# Patient Record
Sex: Male | Born: 1990 | Race: White | Hispanic: No | Marital: Single | Smoking: Former smoker
Health system: Southern US, Community
[De-identification: ages and names within clinical notes are randomized; demographics above are authoritative.]

## PROBLEM LIST (undated history)

## (undated) DIAGNOSIS — F909 Attention-deficit hyperactivity disorder, unspecified type: Secondary | ICD-10-CM

## (undated) DIAGNOSIS — L709 Acne, unspecified: Secondary | ICD-10-CM

## (undated) DIAGNOSIS — G43009 Migraine without aura, not intractable, without status migrainosus: Secondary | ICD-10-CM

## (undated) HISTORY — DX: Migraine without aura, not intractable, without status migrainosus: G43.009

## (undated) HISTORY — DX: Acne, unspecified: L70.9

## (undated) HISTORY — DX: Attention-deficit hyperactivity disorder, unspecified type: F90.9

---
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
## 2010-04-21 ENCOUNTER — Ambulatory Visit: Payer: Self-pay | Admitting: Family Medicine

## 2010-04-21 DIAGNOSIS — J358 Other chronic diseases of tonsils and adenoids: Secondary | ICD-10-CM | POA: Insufficient documentation

## 2010-06-09 NOTE — Assessment & Plan Note (Signed)
Summary: CHECK TONSILS/CLE   Vital Signs:  Patient profile:   20 year old male Height:      67 inches Weight:      162.50 pounds BMI:     25.54 Temp:     98.6 degrees F oral Pulse rate:   80 / minute Pulse rhythm:   regular BP sitting:   112 / 70  (left arm) Cuff size:   regular  Vitals Entered By: Delilah Shan CMA Duncan Dull) (April 21, 2010 2:14 PM) CC: Check tonsils   History of Present Illness: For last month patient has noticed white/yellow spots on tonsil. Malodorus.  No FCNAVD.  Feeling well o/w.   Allergies: No Known Drug Allergies  Social History: Occupation: Graduated [employer] Never Smoked Alcohol use-no Drug use-no Regular exercise-no  Review of Systems       See HPI.  Otherwise negative.    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  Noted tonsil stone, reassured.  D/w patient about mouthwash.  No other intervention needed.  D/w patient about stopping smoking.  follow up as needed.   Complete Medication List: 1)  Proair Hfa 108 (90 Base) Mcg/act Aers (Albuterol sulfate) .... 2 inh q4h as needed shortness of breath 2)  Tretinoin 0.025 % Crea (Tretinoin) .... Apply nightly to clean dry face as directed 3)  Multivitamins Tabs (Multiple vitamin) .... Take 1 tablet by mouth once a day  Patient Instructions: 1)  You have tonsil stones.  I would gargle daily but you shouldn't have to do much else about this.  Take care.    Orders Added: 1)  Est. Patient Level III [56213]    Current Allergies (reviewed today): No known allergies

## 2010-06-09 NOTE — Assessment & Plan Note (Signed)
Summary: CPX/RBH OK PER HEATHER   Vital Signs:  Patient profile:   20 year old male Height:      67 inches Weight:      162.4 pounds BMI:     25.53 Temp:     98.8 degrees F oral Pulse rate:   84 / minute Pulse rhythm:   regular BP sitting:   100 / 70  (left arm) Cuff size:   regular  Vitals Entered By: Benny Lennert CMA Duncan Dull) (December 30, 2009 10:17 AM)  History of Present Illness: Chief complaint cpx  Asthma, had been on proair and asmanex daily  spring  no job      Preventive Screening-Counseling & Management  Alcohol-Tobacco     Alcohol drinks/day: 0     Alcohol Counseling: not indicated; patient does not drink     Smoking Status: never     Tobacco Counseling: not indicated; no tobacco use  Caffeine-Diet-Exercise     Diet Counseling: not indicated; diet is assessed to be healthy     Does Patient Exercise: no     Exercise Counseling: to improve exercise regimen  Hep-HIV-STD-Contraception     HIV Risk: no risk noted     STD Risk: no risk noted     Testicular SE Education/Counseling to perform regular STE      Sexual History:  currently monogamous.        Drug Use:  never.    Current Problems (verified): 1)  Health Maintenance Exam  (ICD-V70.0) 2)  Other Acne  (ICD-706.1) 3)  Asthma  (ICD-493.90) 4)  Adhd  (ICD-314.01)  Allergies (verified): No Known Drug Allergies  Past History:  Past medical, surgical, family and social histories (including risk factors) reviewed, and no changes noted (except as noted below).  Past Medical History: Reviewed history from 12/28/2008 and no changes required. ADHD (ICD-314.01) COMMON MIGRAINE (ICD-346.10) Asthma acne  Past Surgical History: Reviewed history from 11/04/2008 and no changes required. none  Family History: Reviewed history from 11/04/2008 and no changes required. Family History Breast cancer 1st degree relative <50 Family History Diabetes 1st degree relative Family History High  cholesterol Family History Lung cancer Family History of Cardiovascular disorder  Social History: Reviewed history from 11/04/2008 and no changes required. 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 Eyes: Denies blurring. ENT: Denies earache, ear discharge, decreased hearing, nasal congestion, and sore throat. CV: Denies chest pains, dyspnea on exertion, palpitations, and syncope. Resp: asthma, no recent use of inhaler GI: Denies nausea, vomiting, diarrhea, constipation, change in bowel habits, abdominal pain, melena, BRBPR  GU: dysuria, discharge, frequency,genital sores, STD concern. MS: no back pain, joint pain, stiffness, and arthritis. Derm: acne Neuro: No abnormal gait, frequent headaches, paresthesias, seizures, vertigo, and weakness Psych: No anxiety, behavioral problems, compulsive behavior, depression, hyperactivity, and inattentive. Endo: No polydipsia, polyphagia, polyuria, and unusual weight change Heme: No bruising or LAD Allergy: No urticaria or hayfever   Otherwise, the pertinent positives and negatives are listed above and in the HPI, otherwise a full review of systems has been reviewed and is negative unless noted positive.    Impression & Recommendations:  Problem # 1:  HEALTH MAINTENANCE EXAM (ICD-V70.0) The patient's preventative maintenance and recommended screening tests for an annual wellness exam were reviewed in full today.  Brought up to date unless services declined.  Counselled on the importance of diet, exercise, and its role in overall health and mortality. The patient's FH and SH was reviewed, including their home life, tobacco status, and drug and alcohol status.   refill inhaler  change to retin-a  Complete  Medication List: 1)  Proair Hfa 108 (90 Base) Mcg/act Aers (Albuterol sulfate) .... 2 inh q4h as needed shortness of breath 2)  Tretinoin 0.025 % Crea (Tretinoin) .... Apply nightly to clean dry face as directed  Other Orders: Admin 1st Vaccine (04540) Flu Vaccine 84yrs + (98119) Tdap => 22yrs IM (14782) Pneumococcal Vaccine (95621) Admin of Any Addtl Vaccine (30865) Admin of Any Addtl Vaccine (78469) Prescriptions: TRETINOIN 0.025 % CREA (TRETINOIN) Apply nightly to clean dry face as directed  #45 grams x 11   Entered and Authorized by:   Hannah Beat MD   Signed by:   Hannah Beat MD on 12/30/2009   Method used:   Electronically to        [facility]* (retail)       [address]       [place], [place]  [address]       Ph: [phone]       Fax: [phone]   RxID:   [id number] PROAIR HFA 108 (90 BASE) MCG/ACT  AERS (ALBUTEROL SULFATE) 2 inh q4h as needed shortness of breath  #1 x 4   Entered and Authorized by:   Hannah Beat MD   Signed by:   Hannah Beat MD on 12/30/2009   Method used:   Electronically to        [facility]* (retail)       [address]       [place], [place]  [address]       Ph: [phone]       Fax: [phone]   RxID:   [id number]  Flu Vaccine Consent Questions     Do you have a history of severe allergic reactions to this vaccine? no    Any prior history of allergic reactions to egg and/or gelatin? no    Do you have a sensitivity to the preservative Thimersol? no    Do you have a past history of Guillan-Barre Syndrome? no    Do you currently have an acute febrile illness? no    Have you ever had a severe reaction to latex? no    Vaccine information given and explained to patient? yes    Are you currently pregnant? no    Lot Number:[id number]   Exp Date:11/05/2010   Site Given  Left Deltoid IMCurrent Allergies (reviewed today): No known allergies    .lbflu   Immunizations Administered:  Tetanus Vaccine:    Vaccine  Type: Tdap    Site: right deltoid    Mfr: GlaxoSmithKline    Dose: 0.5 ml    Route: IM    Given by: Benny Lennert CMA (AAMA)    Exp. Date: 02/25/2012    Lot #: [id number]    VIS given: 03/26/07 version given December 30, 2009.  Pneumonia Vaccine:    Vaccine Type: Pneumovax    Site: right deltoid    Mfr: Merck    Dose: 0.5 ml    Route: IM    Given by: Benny Lennert CMA (AAMA)    Exp. Date: 07/13/2011    Lot #: [id number]    VIS given: 12/04/95 version given December 30, 2009.    General Medical Physical Exam:  General Appearance:  Well-developed,well-nourished,in no acute distress; alert,appropriate and cooperative throughout examination VS reviewed- afebrile, normotensive  Head:      Inspection:     normocephalic without obvious abnormalities      Palpation:     no abnormal lesions palpable  Eyes:      External:     conjunctiva and lids normal      Pupils:     equal, round, and reactive to light and accommodation      Fundus:     discs sharp and flat; no a/v nicking, hemorrhages, or exudates  Ears, Nose, Throat:      External:     no significant lesions or deformities noted      Otoscopic:     canals clear; tympanic membranes intact with normal light reflex      Hearing:     grossly intact      Nasal:     External nasal examination shows no deformity or inflammation. Nasal mucosa are pink and moist without lesions or exudates.      Dental:     good dentition      Pharynx:     tongue normal; posterior pharynx without erythema or exudate  Neck:      Neck:     supple; no masses; trachea midline      Thyroid:     no nodules, masses, tenderness, or enlargement  Respiratory:      Resp. effort:     no intercostal retractions or use of accessory muscles      Percussion:     no dullness      Palpation:     normal fremitus      Auscultation:     no rales, rhonchi, or wheezes  Chest Wall:      Chest wall:     no masses or gynecomastia      Axilla:     no axillary  adenopathy  Cardiovascular:      Palpation:     no thrill or displacement of PMI      Auscultation:     normal S1 and  S2; no murmur, rub, or gallop  Gastrointestinal:      Abdomen:     soft and non-tender with normal bowel sounds; no masses      Liver/spleen:     normal to percussion; no enlargement or nodularity      Hernia:     no hernias      Stool:     not done  Genitourinary:      Scrotum:     no lesions, cysts, edema, or rash      Penis:     no lesions or discharge  Musculoskeletal:      Gait/station:     normal gait; no ataxia      Digits/nails:     no cyanosis, clubbing, or petechiae      Head/neck:     normal alignment and mobility      Trunk:     normal alignment and mobility; no deformity      RUE:     normal range of motion and strength      RLE:     normal range of motion and strength      LUE:     normal range of motion and strength      LLE:       normal range of motion and strength  Lymphatic:      Neck:  no cervical adenopathy      Axilla:     no axillary adenopathy      Inguinal:     no inguinal adenopathy      Other:     no other adenopathy  Skin:      Inspection:     no rashes, suspicious lesions, or ulcerations      Palpation:     no subcutaneous nodules or induration      Other:     acne  Neurological:      Sensory:     intact to touch  Psychiatric:      Judgement:     intact      Orientation:     oriented to time, place, and person      Memory:     intact for recent and remote events      Mood/affect:     no appearance of anxiety, depression, or agitation

## 2010-06-09 NOTE — Assessment & Plan Note (Signed)
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  Does not hurt too much, maybe just a little sore.  Thought he saw pus on his tonsils as well. No fevers, chills, cough, wheezing, or shortness of breath. Has not had any URI symptoms. No pain or difficulty with swallowing. No sick contacts.  Current Medications (verified): 1)  Proair Hfa 108 (90 Base) Mcg/act  Aers (Albuterol Sulfate) .... 2 Inh Q4h As Needed Shortness of Breath 2)  Clindamycin Phosphate 1 % Gel (Clindamycin Phosphate) .... Apply To Face Two Times A Day, Disp 1 Month Supply 3)  Asmanex 30 Metered Doses 220 Mcg/inh Aepb (Mometasone Furoate) .... One Inhalation Daily  Allergies (verified): No Known Drug Allergies  Review of Systems      See HPI General:  Denies chills, fever, and malaise. Eyes:  Denies blurring. ENT:  Complains of sore throat; denies decreased hearing, difficulty swallowing, hoarseness, nasal congestion, and sinus pressure. CV:  Denies chest pain or discomfort. Resp:  Denies cough. GI:  Denies diarrhea, nausea, and vomiting.  Physical Exam  General:  Well-developed,well-nourished,in no acute distress; alert,appropriate and cooperative throughout examination VS reviewed- afebrile, normotensive Ears:  External ear exam shows no significant lesions or deformities.  Otoscopic examination reveals clear canals, tympanic membranes are intact bilaterally without bulging, retraction, inflammation or discharge. Hearing is grossly normal bilaterally. Nose:  External nasal  examination shows no deformity or inflammation. Nasal mucosa are pink and moist without lesions or exudates. Mouth:  Oral mucosa and oropharynx without lesions or exudates.   mild tonsillar enlargement (appears to be baseline). Lungs:  Normal respiratory effort, chest expands symmetrically. Lungs are clear to auscultation, no crackles or wheezes. Heart:  Normal rate and regular rhythm. S1 and S2 normal without gallop, murmur, click, rub or other extra sounds. Extremities:  no edema Psych:  normally interactive.     Impression & Recommendations:  Problem # 1:  ACUTE PHARYNGITIS (ICD-462) Assessment New Likely viral.  Rapid strep neg with no typical strep symptoms or physical exam findings. Continue supportive care with Tylenol/motrin. Orders: Rapid Strep (52841)  Complete Medication List: 1)  Proair Hfa 108 (90 Base) Mcg/act Aers (Albuterol sulfate) .... 2 inh q4h as needed shortness of breath 2)  Clindamycin Phosphate 1 % Gel (Clindamycin phosphate) .... Apply to face two times a day, disp 1 month supply 3)  Asmanex 30 Metered Doses 220 Mcg/inh Aepb (Mometasone furoate) .... One inhalation daily  Current Allergies (reviewed today): No known allergies   Laboratory Results   Date/Time Reported: June 11, 2009 12:01 PM   Other Tests  Rapid Strep: negative

## 2010-07-20 ENCOUNTER — Ambulatory Visit (INDEPENDENT_AMBULATORY_CARE_PROVIDER_SITE_OTHER): Payer: Self-pay | Admitting: Family Medicine

## 2010-07-20 ENCOUNTER — Encounter (INDEPENDENT_AMBULATORY_CARE_PROVIDER_SITE_OTHER): Payer: Self-pay | Admitting: *Deleted

## 2010-07-20 ENCOUNTER — Encounter: Payer: Self-pay | Admitting: Family Medicine

## 2010-07-20 DIAGNOSIS — J020 Streptococcal pharyngitis: Secondary | ICD-10-CM | POA: Insufficient documentation

## 2010-07-20 LAB — CONVERTED CEMR LAB: Rapid Strep: POSITIVE

## 2010-07-26 NOTE — Letter (Signed)
Summary: Out of Work  [facility] [employer] [facility]  [address] [place], [place] [address]   Phone: [phone]  Fax: [phone]    July 20, 2010   Employee:  Joseph Hutchinson    To Whom It May Concern:   For Medical reasons, please excuse the above named employee from work for the following dates:  Start:July 20, 2010 12:03 PM     End:May return July 22 2010    If you need additional information, please feel free to contact our office.         Sincerely,    Spencer Copland md

## 2010-07-26 NOTE — Assessment & Plan Note (Signed)
Summary: ST/CLE  BCBS   Vital Signs:  Patient profile:   20 year old male Height:      67 inches Weight:      168.50 pounds BMI:     26.49 Temp:     103.0 degrees F oral Pulse rate:   80 / minute Pulse rhythm:   regular BP sitting:   110 / 68  (left arm) Cuff size:   regular  Vitals Entered By: Benny Lennert CMA Duncan Dull) (July 20, 2010 11:52 AM)  History of Present Illness: Chief complaint sore throat  Acute Visit History:      The patient complains of fever, headache, musculoskeletal symptoms, and sore throat.  These symptoms began 3 days ago.  He denies nasal discharge, nausea, and vomiting.  Other comments include: aching and feeling bad.        He has had dysphagia associated with the sore throat.  There is no history of drooling, cold/URI symptoms, or recent exposure to strep.        Urine output has been normal.  He is tolerating clear liquids.        Allergies (verified): No Known Drug Allergies  Past History:  Past medical, surgical, family and social histories (including risk factors) reviewed, and no changes noted (except as noted below).  Past Medical History: Reviewed history from 12/28/2008 and no changes required. ADHD (ICD-314.01) COMMON MIGRAINE (ICD-346.10) Asthma acne  Past Surgical History: Reviewed history from 11/04/2008 and no changes required. none  Family History: Reviewed history from 11/04/2008 and no changes required. Family History Breast cancer 1st degree relative <50 Family History Diabetes 1st degree relative Family History High cholesterol Family History Lung cancer Family History of Cardiovascular disorder  Social History: Reviewed history from 04/21/2010 and no changes required. Occupation: Graduated [employer] Single Never Smoked Alcohol use-no Drug use-no Regular exercise-no  Review of Systems       REVIEW OF SYSTEMS GEN: Acute illness details above. CV: No chest pain or SOB GI: No noted N or V Otherwise, pertinent  positives and negatives are noted in the HPI.   Physical Exam  General:  Well-developed,well-nourished,in no acute distress; alert,appropriate and cooperative throughout examination Head:  Normocephalic and atraumatic without obvious abnormalities. No apparent alopecia or balding. Ears:  External ear exam shows no significant lesions or deformities.  Otoscopic examination reveals clear canals, tympanic membranes are intact bilaterally without bulging, retraction, inflammation or discharge. Hearing is grossly normal bilaterally. Mouth:  large tonsils with significant exudate Lungs:  Normal respiratory effort, chest expands symmetrically. Lungs are clear to auscultation, no crackles or wheezes. Heart:  Normal rate and regular rhythm. S1 and S2 normal without gallop, murmur, click, rub or other extra sounds. Cervical Nodes:  large ant LAD Psych:  Cognition and judgment appear intact. Alert and cooperative with normal attention span and concentration. No apparent delusions, illusions, hallucinations   Impression & Recommendations:  Problem # 1:  STREPTOCOCCAL SORE THROAT (ICD-034.0) Assessment New  Strep + Bicillin LA  Orders: Rapid Strep ([address]) Admin of Therapeutic Inj  intramuscular or subcutaneous (96372) Bicillin CR 1.2 million units Injection (U0454)  I call if not improved in 48 hours.   Complete Medication List: 1)  Proair Hfa 108 (90 Base) Mcg/act Aers (Albuterol sulfate) .... 2 inh q4h as needed shortness of breath 2)  Tretinoin 0.025 % Crea (Tretinoin) .... Apply nightly to clean dry face as directed 3)  Multivitamins Tabs (Multiple vitamin) .... Take 1 tablet by mouth once a day  Medication Administration  Injection # 1:    Medication: Bicillin CR 1.2 million units Injection    Diagnosis: STREPTOCOCCAL SORE THROAT (ICD-034.0)    Route: IM    Site: RUOQ gluteus    Exp Date: 07/06/2012    Lot #: [id number]    Mfr: king    Patient tolerated injection without  complications    Given by: Benny Lennert CMA Duncan Dull) (July 20, 2010 12:50 PM)  Orders Added: 1)  Rapid Strep [66440] 2)  Admin of Therapeutic Inj  intramuscular or subcutaneous [96372] 3)  Bicillin CR 1.2 million units Injection [J0558] 4)  Est. Patient Level IV [34742]    Current Allergies (reviewed today): No known allergies    Laboratory Results    Other Tests  Rapid Strep: positive  Kit Test Internal QC: Negative   (Normal Range: Negative)

## 2010-11-09 ENCOUNTER — Encounter: Payer: Self-pay | Admitting: Family Medicine

## 2010-11-17 ENCOUNTER — Encounter: Payer: BC Managed Care – PPO | Admitting: Family Medicine

## 2010-11-21 ENCOUNTER — Encounter: Payer: Self-pay | Admitting: Family Medicine

## 2010-11-21 ENCOUNTER — Ambulatory Visit (INDEPENDENT_AMBULATORY_CARE_PROVIDER_SITE_OTHER): Payer: BC Managed Care – PPO | Admitting: Family Medicine

## 2010-11-21 VITALS — BP 120/70 | HR 76 | Temp 98.0°F | Ht 68.0 in | Wt 159.1 lb

## 2010-11-21 DIAGNOSIS — Z0000 Encounter for general adult medical examination without abnormal findings: Secondary | ICD-10-CM

## 2010-11-21 MED ORDER — CITALOPRAM HYDROBROMIDE 20 MG PO TABS: 20.0000 mg | ORAL_TABLET | Freq: Every day | ORAL | Status: DC

## 2010-11-21 NOTE — Patient Instructions (Signed)
F/u 4-6 weeks

## 2010-11-21 NOTE — Progress Notes (Signed)
Joseph Hutchinson, a 20 y.o. male presents today in the office for the following:    11 - 3:30 AM. Ups. Gets home around 4, tried to go to bed then.  5 days a week Sleeping 10-12 hours a night No coffee, no energy drinks. 3-4 cups of iced tea a day  Has been having some problems getting to sleep Allergies are flaring up some  Having a hard time getting to sleep.  Rarely using inhaler  Preventative Health Maintenance Visit:  Health Maintenance Summary Reviewed and updated, unless pt declines services.  Tobacco History Reviewed. Alcohol: No concerns, no excessive use Exercise Habits: Some activity, rec at least 30 mins 5 times a week STD concerns: no risk or activity to increase risk Drug Use: None Encouraged self-testicular check  Health Maintenance  Topic Date Due  . Tetanus/tdap  12/31/2019   Patient Active Problem List  Diagnoses  . ADHD  . ASTHMA  . OTHER ACNE  . Routine general medical examination at a health care facility   Past Medical History  Diagnosis Date  . ADHD (attention deficit hyperactivity disorder)   . Common migraine   . Asthma   . Acne    No past surgical history on file. History  Substance Use Topics  . Smoking status: Never Smoker   . Smokeless tobacco: Not on file  . Alcohol Use: No   Family History  Problem Relation Age of Onset  . Breast cancer      1st degree relative <50  . Diabetes      1st degree relative  . Hyperlipidemia      fam hx  . Lung cancer      fam hx  . Heart disease      fam hx   No Known Allergies Current Outpatient Prescriptions on File Prior to Visit  Medication Sig Dispense Refill  . albuterol (PROAIR HFA) 108 (90 BASE) MCG/ACT inhaler Inhale 2 puffs into the lungs every 4 (four) hours as needed. Shortness of breath       . tretinoin (RETIN-A) 0.025 % cream Apply topically at bedtime. To a clean dry face as directed       . Multiple Vitamin (MULTIVITAMIN) tablet Take 1 tablet by mouth daily.          General: Denies fever, chills, sweats. No significant weight loss. Sleep probs noted Eyes: Denies blurring,significant itching ENT: Denies earache, sore throat, and hoarseness. Cardiovascular: Denies chest pains, palpitations, dyspnea on exertion Respiratory: Denies cough, dyspnea at rest,wheeezing Breast: no concerns about lumps GI: Denies nausea, vomiting, diarrhea, constipation, change in bowel habits, abdominal pain, melena, hematochezia GU: Denies penile discharge, ED, urinary flow / outflow problems. No STD concerns. Musculoskeletal: Denies back pain, joint pain Derm: Denies rash, itching Neuro: Denies  paresthesias, frequent falls, frequent headaches Psych: + dep Endocrine: Denies cold intolerance, heat intolerance, polydipsia Heme: Denies enlarged lymph nodes Allergy: occ allergies   Physical Exam  Blood pressure 120/70, pulse 76, temperature 98 F (36.7 C), temperature source Oral, height 5\' 8"  (1.727 m), weight 159 lb 1.9 oz (72.176 kg).  PE: GEN: well developed, well nourished, no acute distress Eyes: conjunctiva and lids normal, PERRLA, EOMI ENT: TM clear, nares clear, oral exam WNL Neck: supple, no lymphadenopathy, no thyromegaly, no JVD Pulm: clear to auscultation and percussion, respiratory effort normal CV: regular rate and rhythm, S1-S2, no murmur, rub or gallop, no bruits, peripheral pulses normal and symmetric, no cyanosis, clubbing, edema or varicosities Chest: no scars, masses,  no gynecomastia   GI: soft, non-tender; no hepatosplenomegaly, masses; active bowel sounds all quadrants GU: no hernia, testicular mass, penile discharge Lymph: no cervical, axillary or inguinal adenopathy MSK: gait normal, muscle tone and strength WNL, no joint swelling, effusions, discoloration, crepitus  SKIN: clear, good turgor, color WNL, no rashes, lesions, or ulcerations Neuro: normal mental status, normal strength, sensation, and motion Psych: alert; oriented to person,  place and time. Affect more flat than is typical for this patient  Assessment and Plan: 1.  The patient's preventative maintenance and recommended screening tests for an annual wellness exam were reviewed in full today. Brought up to date unless services declined.  Counselled on the importance of diet, exercise, and its role in overall health and mortality. The patient's FH and SH was reviewed, including their home life, tobacco status, and drug and alcohol status.  Discussed sleep hygiene Melatonin I think he is depressed -- he feels depressed. He wants to talk about going on an antidepressant with Mom before starting

## 2010-11-22 LAB — BASIC METABOLIC PANEL
BUN: 14 mg/dL (ref 6–23)
CO2: 32 mEq/L (ref 19–32)
Calcium: 9.3 mg/dL (ref 8.4–10.5)
Chloride: 105 mEq/L (ref 96–112)
Creatinine, Ser: 1 mg/dL (ref 0.4–1.5)
GFR: 100.78 mL/min (ref >60.00)
Glucose, Bld: 90 mg/dL (ref 70–99)
Potassium: 4.3 mEq/L (ref 3.5–5.1)
Sodium: 140 mEq/L (ref 135–145)

## 2010-11-22 LAB — LDL CHOLESTEROL, DIRECT: Direct LDL: 122.6 mg/dL

## 2012-06-12 ENCOUNTER — Ambulatory Visit (INDEPENDENT_AMBULATORY_CARE_PROVIDER_SITE_OTHER): Payer: BC Managed Care – PPO | Admitting: Family Medicine

## 2012-06-12 ENCOUNTER — Encounter: Payer: Self-pay | Admitting: Family Medicine

## 2012-06-12 VITALS — BP 120/70 | HR 63 | Temp 97.6°F | Ht 68.0 in | Wt 155.8 lb

## 2012-06-12 DIAGNOSIS — M549 Dorsalgia, unspecified: Secondary | ICD-10-CM

## 2012-06-12 DIAGNOSIS — Z202 Contact with and (suspected) exposure to infections with a predominantly sexual mode of transmission: Secondary | ICD-10-CM

## 2012-06-12 DIAGNOSIS — J111 Influenza due to unidentified influenza virus with other respiratory manifestations: Secondary | ICD-10-CM

## 2012-06-12 DIAGNOSIS — Z2089 Contact with and (suspected) exposure to other communicable diseases: Secondary | ICD-10-CM

## 2012-06-12 MED ORDER — AZITHROMYCIN 250 MG PO TABS: ORAL_TABLET | ORAL | Status: DC

## 2012-06-12 MED ORDER — CEFTRIAXONE SODIUM 1 G IJ SOLR
250.0000 mg | Freq: Once | INTRAMUSCULAR | Status: AC
  Administered 2012-06-12: 250 mg via INTRAMUSCULAR

## 2012-06-12 NOTE — Progress Notes (Signed)
Nature conservation officer at [facility] [address] [place] [address] Phone: [phone] Fax: [phone]  Date:  06/12/2012   Name:  Joseph Hutchinson   DOB:  03/15/1991   MRN:  [id number] Gender: male Age: 22 y.o.  Primary Physician:  Hannah Beat, MD  Evaluating MD: Hannah Beat, MD   Chief Complaint: Follow-up and check for std   History of Present Illness:  MCDONALD Joseph Hutchinson is a 22 y.o. pleasant patient who presents with the following:  STD exposure: Hurting and everything hurts. Girlfriend has chlamydia. No penile discharge, no warts, no ulcers. No known prior STI. 3 different girlfriends in a short time.   LBP, ? occ radiculopathy on the L, occ tingling. No bowel or bladder incont. Both sides, erector spinae.   Getting over flu, no asthma sx, resolving cough  Patient Active Problem List  Diagnosis  . ADHD  . ASTHMA  . OTHER ACNE    Past Medical History  Diagnosis Date  . ADHD (attention deficit hyperactivity disorder)   . Common migraine   . Asthma   . Acne     No past surgical history on file.  History  Substance Use Topics  . Smoking status: Never Smoker   . Smokeless tobacco: Not on file  . Alcohol Use: No    Family History  Problem Relation Age of Onset  . Breast cancer      1st degree relative <50  . Diabetes      1st degree relative  . Hyperlipidemia      fam hx  . Lung cancer      fam hx  . Heart disease      fam hx    No Known Allergies  Medication list has been reviewed and updated.  Outpatient Prescriptions Prior to Visit  Medication Sig Dispense Refill  . citalopram (CELEXA) 20 MG tablet Take 1 tablet (20 mg total) by mouth daily.  30 tablet  1  . [DISCONTINUED] albuterol (PROAIR HFA) 108 (90 BASE) MCG/ACT inhaler Inhale 2 puffs into the lungs every 4 (four) hours as needed. Shortness of breath       . [DISCONTINUED] Multiple Vitamin (MULTIVITAMIN) tablet Take 1 tablet by mouth daily.        . [DISCONTINUED]  tretinoin (RETIN-A) 0.025 % cream Apply topically at bedtime. To a clean dry face as directed        Last reviewed on 11/21/2010  4:53 PM by Hannah Beat, MD  Review of Systems:   GEN: recent flu GI: No n/v/d, eating normally Pulm: No SOB Interactive and getting along well at home.  Otherwise, ROS is as per the HPI.   Physical Examination: BP 120/70  Pulse 63  Temp 97.6 F (36.4 C) (Oral)  Ht 5\' 8"  (1.727 m)  Wt 155 lb 12 oz (70.648 kg)  BMI 23.68 kg/m2  SpO2 97%  Ideal Body Weight: Weight in (lb) to have BMI = 25: 164.1    GEN: WDWN, NAD, Non-toxic, Alert & Oriented x 3 HEENT: Atraumatic, Normocephalic.  Ears and Nose: No external deformity. EXTR: No clubbing/cyanosis/edema NEURO: Normal gait.  PSYCH: Normally interactive. Conversant. Not depressed or anxious appearing.  Calm demeanor.  GU: no lesions, normal descended male. O/w normal. MSK> TTP B erector spinae, L2-s1. SLR neg. neurovasc intact.  Assessment and Plan:  1. Exposure to STD  GC/chlamydia probe amp, urine, HIV antibody, RPR, Trichomonas vaginalis, RNA, cefTRIAXone (ROCEPHIN) injection 250 mg  2. Influenza  3. Back pain     Known girlfriend with CMZ, rx with azithro and rocephin to cover gonorrhea too  [employer] back program  Orders Today:  Orders Placed This Encounter  Procedures  . Trichomonas vaginalis, RNA  . GC/chlamydia probe amp, urine  . HIV antibody  . RPR    Updated Medication List: (Includes new medications, updates to list, dose adjustments) Meds ordered this encounter  Medications  . azithromycin (ZITHROMAX) 250 MG tablet    Sig: 4 tablets po at once    Dispense:  4 tablet    Refill:  0  . cefTRIAXone (ROCEPHIN) injection 250 mg    Sig:     Medications Discontinued: Medications Discontinued During This Encounter  Medication Reason  . tretinoin (RETIN-A) 0.025 % cream Error  . Multiple Vitamin (MULTIVITAMIN) tablet Error  . albuterol (PROAIR HFA) 108 (90 BASE) MCG/ACT  inhaler Error     Signed, Noretta Frier T. Jillyan Plitt, MD 06/12/2012 11:16 AM

## 2012-06-14 ENCOUNTER — Encounter: Payer: Self-pay | Admitting: *Deleted

## 2012-06-15 LAB — GC/CHLAMYDIA PROBE AMP, URINE: GC Probe Amp, Urine: NEGATIVE

## 2012-06-19 LAB — TRICHOMONAS VAGINALIS, PROBE AMP: T vaginalis RNA: NEGATIVE

## 2012-06-24 ENCOUNTER — Encounter: Payer: Self-pay | Admitting: *Deleted

## 2012-06-26 ENCOUNTER — Telehealth: Payer: Self-pay

## 2012-06-26 NOTE — Telephone Encounter (Signed)
Brandi with [facility] Dept left v/m requesting treatment info; pt seen 06/13/12.with positive test results and Brandi did not receive report of treatment for pt. Brandi needs to know pt was notified and treated and info including pt's DOB,name of medication pt was treated with,dosage and date med was given.Brandi request call back.

## 2012-06-26 NOTE — Telephone Encounter (Signed)
06/13/2012 ov Given Azithromycin 2 grams po and Rocephin 250 mg IM  Give other info

## 2012-06-27 NOTE — Telephone Encounter (Signed)
Joseph Hutchinson at health dept advised

## 2012-09-03 ENCOUNTER — Encounter: Payer: Self-pay | Admitting: Family Medicine

## 2012-09-03 ENCOUNTER — Encounter: Payer: Self-pay | Admitting: *Deleted

## 2012-09-03 ENCOUNTER — Ambulatory Visit (INDEPENDENT_AMBULATORY_CARE_PROVIDER_SITE_OTHER): Payer: BC Managed Care – PPO | Admitting: Family Medicine

## 2012-09-03 VITALS — HR 92 | Temp 98.5°F | Ht 68.0 in | Wt 154.5 lb

## 2012-09-03 DIAGNOSIS — J029 Acute pharyngitis, unspecified: Secondary | ICD-10-CM | POA: Insufficient documentation

## 2012-09-03 DIAGNOSIS — Z8619 Personal history of other infectious and parasitic diseases: Secondary | ICD-10-CM

## 2012-09-03 DIAGNOSIS — Z202 Contact with and (suspected) exposure to infections with a predominantly sexual mode of transmission: Secondary | ICD-10-CM

## 2012-09-03 LAB — POCT RAPID STREP A (OFFICE): Rapid Strep A Screen: NEGATIVE

## 2012-09-03 MED ORDER — AMOXICILLIN-POT CLAVULANATE 875-125 MG PO TABS: 1.0000 | ORAL_TABLET | Freq: Two times a day (BID) | ORAL | Status: DC

## 2012-09-03 NOTE — Progress Notes (Signed)
  Subjective:    Patient ID: Joseph Hutchinson, male    DOB: 1991-04-11, 22 y.o.   MRN: [id number]  Sore Throat  This is a new problem. The current episode started in the past 7 days. The problem has been gradually worsening. Maximum temperature: subjective. The pain is at a severity of 2/10. The pain is mild. Associated symptoms include congestion, coughing, neck pain, shortness of breath, swollen glands and trouble swallowing. Pertinent negatives include no diarrhea, drooling, ear discharge, ear pain or vomiting. Associated symptoms comments: Sinus pressure  dizziness Sore thraot and swollen tonsils started in last 3-4 days Heart racing, fatigued. He has had no exposure to strep or mono. He has tried NSAIDs for the symptoms. The treatment provided mild relief.  Cough The problem occurs hourly. The cough is non-productive. Associated symptoms include a fever and shortness of breath. Pertinent negatives include no ear pain. His past medical history is significant for asthma. There is no history of emphysema.   Had chlamydia iunfeciton in 06/2012.. Covered with azithromycin.  He wishes to be re-tested for this for resolution. No symptoms.     Review of Systems  Constitutional: Positive for fever. Negative for fatigue.  HENT: Positive for congestion, trouble swallowing and neck pain. Negative for ear pain, drooling and ear discharge.   Eyes: Negative for pain.  Respiratory: Positive for cough and shortness of breath.   Gastrointestinal: Negative for vomiting and diarrhea.  Genitourinary: Negative for urgency, scrotal swelling and penile pain.       Objective:   Physical Exam  Constitutional: Vital signs are normal. He appears well-developed and well-nourished.  Non-toxic appearance. He does not appear ill. No distress.  HENT:  Head: Normocephalic and atraumatic.  Right Ear: Hearing, tympanic membrane, external ear and ear canal normal. No tenderness. No foreign bodies. Tympanic membrane is  not retracted and not bulging.  Left Ear: Hearing, tympanic membrane, external ear and ear canal normal. No tenderness. No foreign bodies. Tympanic membrane is not retracted and not bulging.  Nose: Nose normal. No mucosal edema or rhinorrhea. Right sinus exhibits no maxillary sinus tenderness and no frontal sinus tenderness. Left sinus exhibits no maxillary sinus tenderness and no frontal sinus tenderness.  Mouth/Throat: Uvula is midline and mucous membranes are normal. Normal dentition. No dental caries. Oropharyngeal exudate, posterior oropharyngeal edema and posterior oropharyngeal erythema present. No tonsillar abscesses.  copius pus in oropharynx on tonsils, left tonsil enlarged and firm, brownish red discoloration  Eyes: Conjunctivae, EOM and lids are normal. Pupils are equal, round, and reactive to light. No foreign bodies found.  Neck: Trachea normal, normal range of motion and phonation normal. Neck supple. Carotid bruit is not present. No mass and no thyromegaly present.  Cardiovascular: Normal rate, regular rhythm, S1 normal, S2 normal, normal heart sounds, intact distal pulses and normal pulses.  Exam reveals no gallop.   No murmur heard. Pulmonary/Chest: Effort normal and breath sounds normal. No respiratory distress. He has no wheezes. He has no rhonchi. He has no rales.  Abdominal: Soft. Normal appearance and bowel sounds are normal. There is no hepatosplenomegaly. There is no tenderness. There is no rebound, no guarding and no CVA tenderness. No hernia.  Neurological: He is alert. He has normal reflexes.  Skin: Skin is warm, dry and intact. No rash noted.  Psychiatric: He has a normal mood and affect. His speech is normal and behavior is normal. Judgment normal.          Assessment & Plan:

## 2012-09-03 NOTE — Assessment & Plan Note (Signed)
Re-evaluate for resolution .

## 2012-09-03 NOTE — Assessment & Plan Note (Signed)
Strep test negative in office ,.. But copius pus on tonsils. Treat with broad antibiotics Augmentin. If not improving in 48-72 hours.. Consider ENT referral given unusual appearance on left tonsil with discoloration.

## 2012-09-03 NOTE — Patient Instructions (Addendum)
Start augmentin for 10 days. Start mucinex DM for nasal congestion and cough, add nasal saline irrigation or spray. Call if sore throat not improving some in 48-72 hours or if continue with measured fever, or any diffiuclty swallowing . If severe swallowing issues or shortness of breath.. Go to ER.  Stay put of work until 24 hour after fever is resolved.  Return first morning urine of the day sample.

## 2012-09-04 ENCOUNTER — Encounter: Payer: Self-pay | Admitting: *Deleted

## 2012-09-04 ENCOUNTER — Telehealth: Payer: Self-pay

## 2012-09-04 ENCOUNTER — Telehealth: Payer: Self-pay | Admitting: *Deleted

## 2012-09-04 NOTE — Telephone Encounter (Signed)
Patient calling wanting an ent referral to see if they will take his tonsils out. I explained to patient just b/c he goes to ent it doesn't mean they will take them out

## 2012-09-04 NOTE — Telephone Encounter (Signed)
Check on him - on augmentin.  If not getting better, i might should check for a couple of other things

## 2012-09-04 NOTE — Telephone Encounter (Signed)
Triage Record Num: [id number] Operator: Sula Rumple Patient Name: Joseph Hutchinson Call Date & Time: 09/03/2012 10:32:59PM Patient Phone: [phone] PCP: Linward Headland Patient Gender: Male PCP Fax : [phone] Patient DOB: 1990-11-25 Practice Name: [facility] Reason for Call: Caller: Annette/Mother; PCP: Hannah Beat (Family Practice); CB#: [phone]; Call regarding Fever 103; Mom/Annette calling on 09/03/12 states pt was seen today in the office with a sore throat/strep culture was negative/ pt had a temp of 99 in the office/ and is now 103. Pt was ordered an antibiotic and has taken one dose today/ pt was given 800 mg of Motrin 30-40 minutes ago. All emergent sx ruled out per Sore Throat Protocol: See in 24 hrs per Had a negative rapid strep test/having continued symptoms of sroe throat, fever. swollen glands. Protocol(s) Used: Sore Throat or Hoarseness Recommended Outcome per Protocol: See Provider within 24 hours Reason for Outcome: Had negative rapid strep test but no throat culture; having continued symptoms of sore throat, fever, swollen glands Care Advice: ~

## 2012-09-04 NOTE — Telephone Encounter (Signed)
Spoke with patient and he is feeling a little bit better and does not have a fever now. Patient will call back if fever comes back

## 2012-09-04 NOTE — Telephone Encounter (Signed)
He just needs to get better from his acute infection - there is no way they would do it now.

## 2012-09-05 LAB — GC/CHLAMYDIA PROBE AMP, URINE: GC Probe Amp, Urine: NEGATIVE

## 2012-09-05 NOTE — Telephone Encounter (Signed)
Message left advising patient. Instructed to call once he is better if he still wants to pursue referral.

## 2012-09-06 ENCOUNTER — Encounter: Payer: Self-pay | Admitting: *Deleted

## 2012-09-09 ENCOUNTER — Telehealth: Payer: Self-pay | Admitting: *Deleted

## 2012-09-09 NOTE — Telephone Encounter (Signed)
Opened in error

## 2013-03-14 ENCOUNTER — Ambulatory Visit (INDEPENDENT_AMBULATORY_CARE_PROVIDER_SITE_OTHER)
Admission: RE | Admit: 2013-03-14 | Discharge: 2013-03-14 | Disposition: A | Discharge status: Home / Self Care | Payer: BC Managed Care – PPO | Source: Ambulatory Visit | Attending: Family Medicine | Admitting: Family Medicine

## 2013-03-14 ENCOUNTER — Encounter: Payer: Self-pay | Admitting: Family Medicine

## 2013-03-14 ENCOUNTER — Ambulatory Visit (INDEPENDENT_AMBULATORY_CARE_PROVIDER_SITE_OTHER): Payer: BC Managed Care – PPO | Admitting: Family Medicine

## 2013-03-14 VITALS — BP 122/80 | HR 80 | Temp 97.5°F | Wt 160.0 lb

## 2013-03-14 DIAGNOSIS — S93409A Sprain of unspecified ligament of unspecified ankle, initial encounter: Secondary | ICD-10-CM

## 2013-03-14 DIAGNOSIS — S93402A Sprain of unspecified ligament of left ankle, initial encounter: Secondary | ICD-10-CM

## 2013-03-14 MED ORDER — HYDROCODONE-ACETAMINOPHEN 5-325 MG PO TABS: 1.0000 | ORAL_TABLET | Freq: Three times a day (TID) | ORAL | Status: DC | PRN

## 2013-03-14 NOTE — Patient Instructions (Signed)
Take up to 600mg  of ibuprofen up to 3 times a day with food.  Take hydrocodone if needed.  Use the crutches and CAM walker for now.  Gradually stop using the crutches.  Ice your foot a few times a day.  Don't drive in the CAM walker.  Give [place] up update next week. We may need to re-xray you.   Take care.

## 2013-03-14 NOTE — Progress Notes (Signed)
Pre-visit discussion using our clinic review tool. No additional management support is needed unless otherwise documented below in the visit note.  Stepped in a hole this AM.  Heard a pop.  Had a CAM walker and crutches already.  Pain with weight bearing.  Inversion mechanism of injury.    Meds, vitals, and allergies reviewed.   ROS: See HPI.  Otherwise, noncontributory.  nad but uncomfortable L foot with inspection noted for puffiness but no bruising.   Mortise stable Puffy and ttp at lateral mal.  5th MT not ttp Distally NV intact  xrays neg for fx

## 2013-03-17 ENCOUNTER — Encounter: Payer: Self-pay | Admitting: *Deleted

## 2013-03-17 ENCOUNTER — Telehealth: Payer: Self-pay | Admitting: *Deleted

## 2013-03-17 DIAGNOSIS — S93409A Sprain of unspecified ligament of unspecified ankle, initial encounter: Secondary | ICD-10-CM | POA: Insufficient documentation

## 2013-03-17 NOTE — Assessment & Plan Note (Signed)
Has a CAM walker, use that and crutches.  Advised not to drive in the boot.  Pain meds prn and OTC nsaids with GI caution, discussed.  Call back about f/u with PCP re: ankle.  He agrees.  Work note given.  Ice ankle in meantime.

## 2013-03-17 NOTE — Telephone Encounter (Signed)
Faxed

## 2013-03-17 NOTE — Telephone Encounter (Signed)
Give him a note, okay to return to work.  Thanks.

## 2013-03-17 NOTE — Telephone Encounter (Signed)
Patient was in on Friday with ankle problem.  He was given a work release note but the ankle started feeling so much better over the weekend that he reported to work today but they won't let him work.  Can he get a note faxed saying it is ok to return to work?  Fax # [phone]

## 2013-04-10 ENCOUNTER — Ambulatory Visit: Payer: BC Managed Care – PPO | Admitting: Family Medicine

## 2013-07-01 ENCOUNTER — Ambulatory Visit (INDEPENDENT_AMBULATORY_CARE_PROVIDER_SITE_OTHER): Payer: BC Managed Care – PPO | Admitting: Internal Medicine

## 2013-07-01 ENCOUNTER — Encounter: Payer: Self-pay | Admitting: Internal Medicine

## 2013-07-01 VITALS — BP 114/72 | HR 77 | Temp 97.9°F | Wt 168.0 lb

## 2013-07-01 DIAGNOSIS — B353 Tinea pedis: Secondary | ICD-10-CM

## 2013-07-01 MED ORDER — TERBINAFINE HCL 1 % EX CREA: 1.0000 "application " | TOPICAL_CREAM | Freq: Two times a day (BID) | CUTANEOUS | Status: AC

## 2013-07-01 NOTE — Progress Notes (Signed)
   Subjective:    Patient ID: Joseph Hutchinson, male    DOB: 12-Jul-1990, 23 y.o.   MRN: [id number]  HPI  Pt presents to the clinic today with c/o athletes foot. He noticed this about 2 years ago. He reports it seems to be worse on the left. He has not tried anything OTC. He does report that he walks around in boots all day.  Review of Systems      Past Medical History  Diagnosis Date  . ADHD (attention deficit hyperactivity disorder)   . Common migraine   . Asthma   . Acne     No current outpatient prescriptions on file.   No current facility-administered medications for this visit.    No Known Allergies  Family History  Problem Relation Age of Onset  . Breast cancer      1st degree relative <50  . Diabetes      1st degree relative  . Hyperlipidemia      fam hx  . Lung cancer      fam hx  . Heart disease      fam hx    History   Social History  . Marital Status: Single    Spouse Name: N/A    Number of Children: N/A  . Years of Education: N/A   Occupational History  . Not on file.   Social History Main Topics  . Smoking status: Former Games developermoker  . Smokeless tobacco: Never Used  . Alcohol Use: Yes     Comment: occasional  . Drug Use: No  . Sexual Activity: Not on file   Other Topics Concern  . Not on file   Social History Narrative   Regular exercise: no   Graduated [employer]     Constitutional: Denies fever, malaise, fatigue, headache or abrupt weight changes.  Skin: Pt reports rash on his bilateral feet. Denies lesions or ulcercations.    No other specific complaints in a complete review of systems (except as listed in HPI above).  Objective:   Physical Exam  BP 114/72  Pulse 77  Temp(Src) 97.9 F (36.6 C) (Oral)  Wt 168 lb (76.204 kg)  SpO2 98% Wt Readings from Last 3 Encounters:  07/01/13 168 lb (76.204 kg)  03/14/13 160 lb (72.576 kg)  09/03/12 154 lb 8 oz (70.081 kg)    General: Appears his stated age, well developed, well  nourished in NAD. Skin: Wet and macerated in between toes on the left. Cardiovascular: Normal rate and rhythm. S1,S2 noted.  No murmur, rubs or gallops noted. No JVD or BLE edema. No carotid bruits noted. Pulmonary/Chest: Normal effort and positive vesicular breath sounds. No respiratory distress. No wheezes, rales or ronchi noted.           Assessment & Plan:   Tinea Pedis:  Erx for terbinafine 1 % cream-apply as directed Try to keep your feet dry- take an extra pair of sock to work to change into if your feet get wet  RTC as needed

## 2013-07-01 NOTE — Progress Notes (Signed)
Pre visit review using our clinic review tool, if applicable. No additional management support is needed unless otherwise documented below in the visit note. 

## 2013-07-01 NOTE — Patient Instructions (Addendum)
Athlete's Foot Athlete's foot (tinea pedis) is a fungal infection of the skin on the feet. It often occurs on the skin between the toes or underneath the toes. It can also occur on the soles of the feet. Athlete's foot is more likely to occur in hot, humid weather. Not washing your feet or changing your socks often enough can contribute to athlete's foot. The infection can spread from person to person (contagious). CAUSES Athlete's foot is caused by a fungus. This fungus thrives in warm, moist places. Most people get athlete's foot by sharing shower stalls, towels, and wet floors with an infected person. People with weakened immune systems, including those with diabetes, may be more likely to get athlete's foot. SYMPTOMS   Itchy areas between the toes or on the soles of the feet.  White, flaky, or scaly areas between the toes or on the soles of the feet.  Tiny, intensely itchy blisters between the toes or on the soles of the feet.  Tiny cuts on the skin. These cuts can develop a bacterial infection.  Thick or discolored toenails. DIAGNOSIS  Your caregiver can usually tell what the problem is by doing a physical exam. Your caregiver may also take a skin sample from the rash area. The skin sample may be examined under a microscope, or it may be tested to see if fungus will grow in the sample. A sample may also be taken from your toenail for testing. TREATMENT  Over-the-counter and prescription medicines can be used to kill the fungus. These medicines are available as powders or creams. Your caregiver can suggest medicines for you. Fungal infections respond slowly to treatment. You may need to continue using your medicine for several weeks. PREVENTION   Do not share towels.  Wear sandals in wet areas, such as shared locker rooms and shared showers.  Keep your feet dry. Wear shoes that allow air to circulate. Wear cotton or wool socks. HOME CARE INSTRUCTIONS   Take medicines as directed by  your caregiver. Do not use steroid creams on athlete's foot.  Keep your feet clean and cool. Wash your feet daily and dry them thoroughly, especially between your toes.  Change your socks every day. Wear cotton or wool socks. In hot climates, you may need to change your socks 2 to 3 times per day.  Wear sandals or canvas tennis shoes with good air circulation.  If you have blisters, soak your feet in Burow's solution or Epsom salts for 20 to 30 minutes, 2 times a day to dry out the blisters. Make sure you dry your feet thoroughly afterward. SEEK MEDICAL CARE IF:   You have a fever.  You have swelling, soreness, warmth, or redness in your foot.  You are not getting better after 7 days of treatment.  You are not completely cured after 30 days.  You have any problems caused by your medicines. MAKE SURE YOU:   Understand these instructions.  Will watch your condition.  Will get help right away if you are not doing well or get worse. Document Released: 04/21/2000 Document Revised: 07/17/2011 Document Reviewed: 02/10/2011 ExitCare Patient Information 2014 [employer].  

## 2015-05-22 IMAGING — CR DG ANKLE COMPLETE 3+V*L*
2 series · 2 of 2 positions shown · non-contrast
Comparison: None.

CLINICAL DATA: Sprain

EXAM:
LEFT ANKLE COMPLETE - 3+ VIEW

[view not recorded (1 of 2)]
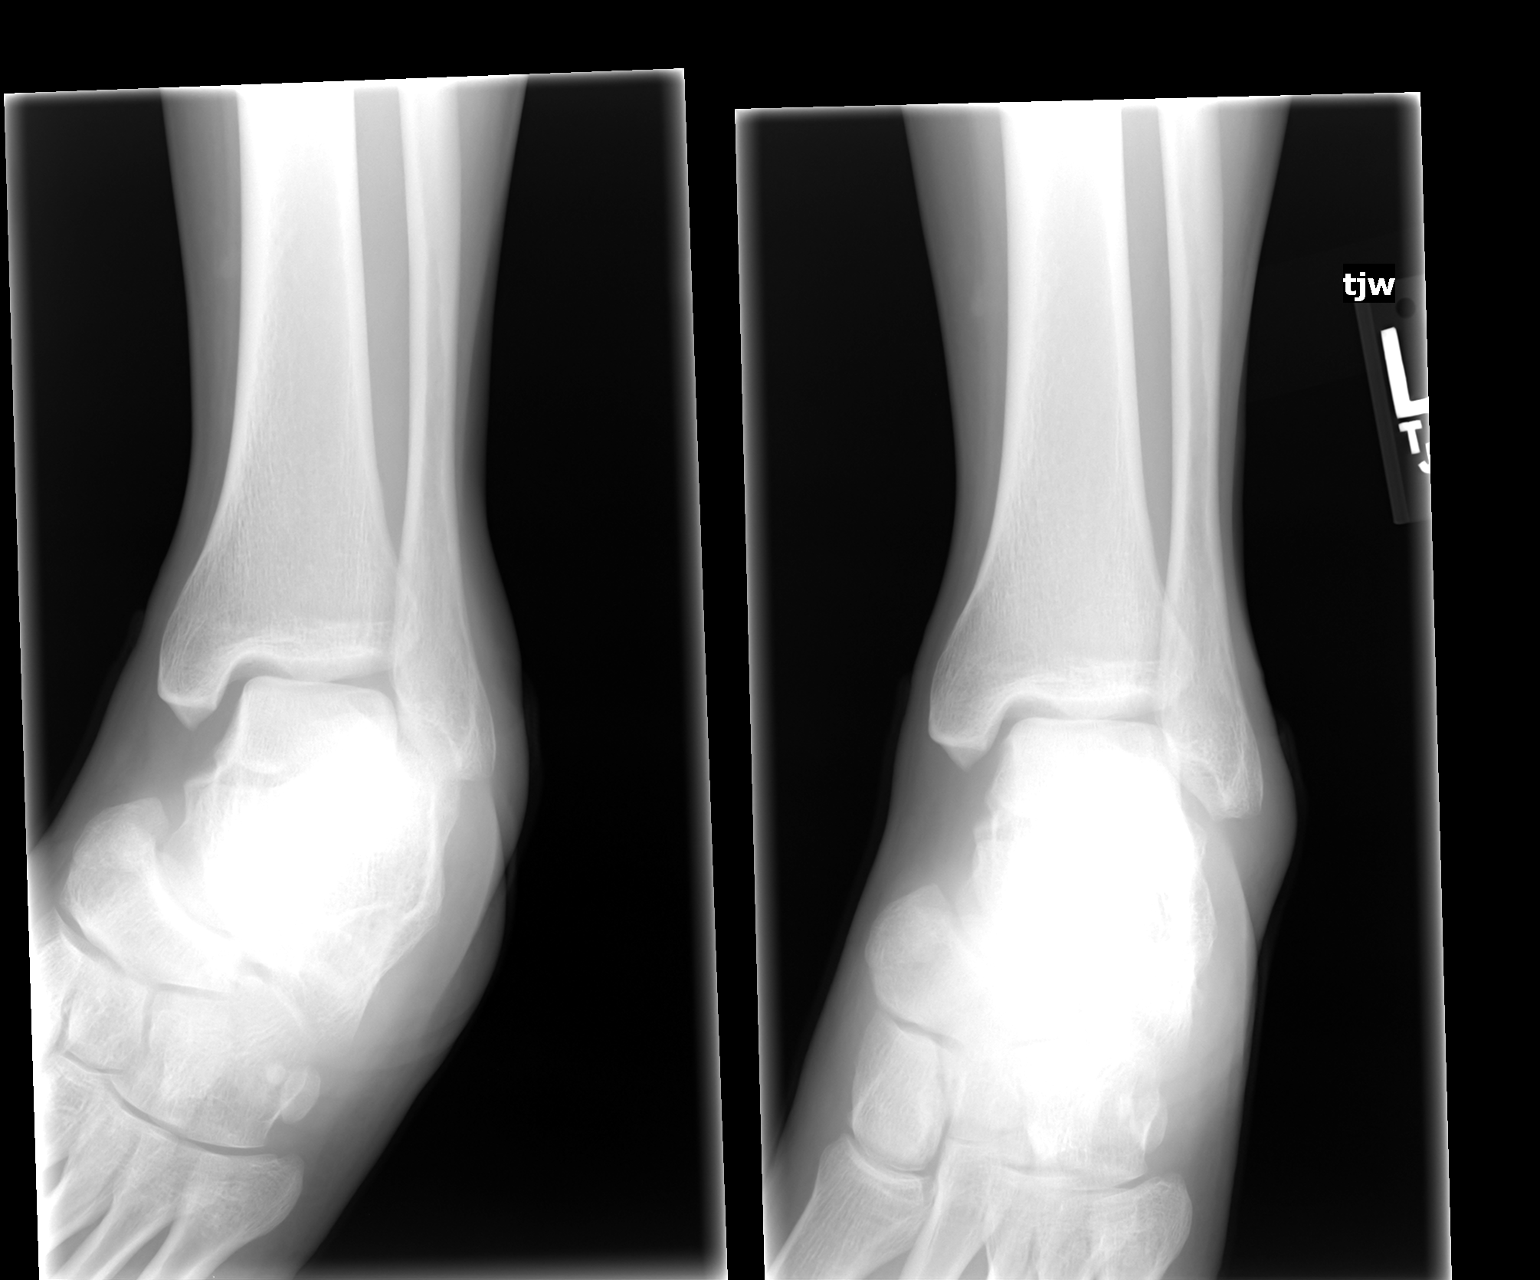

[view not recorded (2 of 2)]
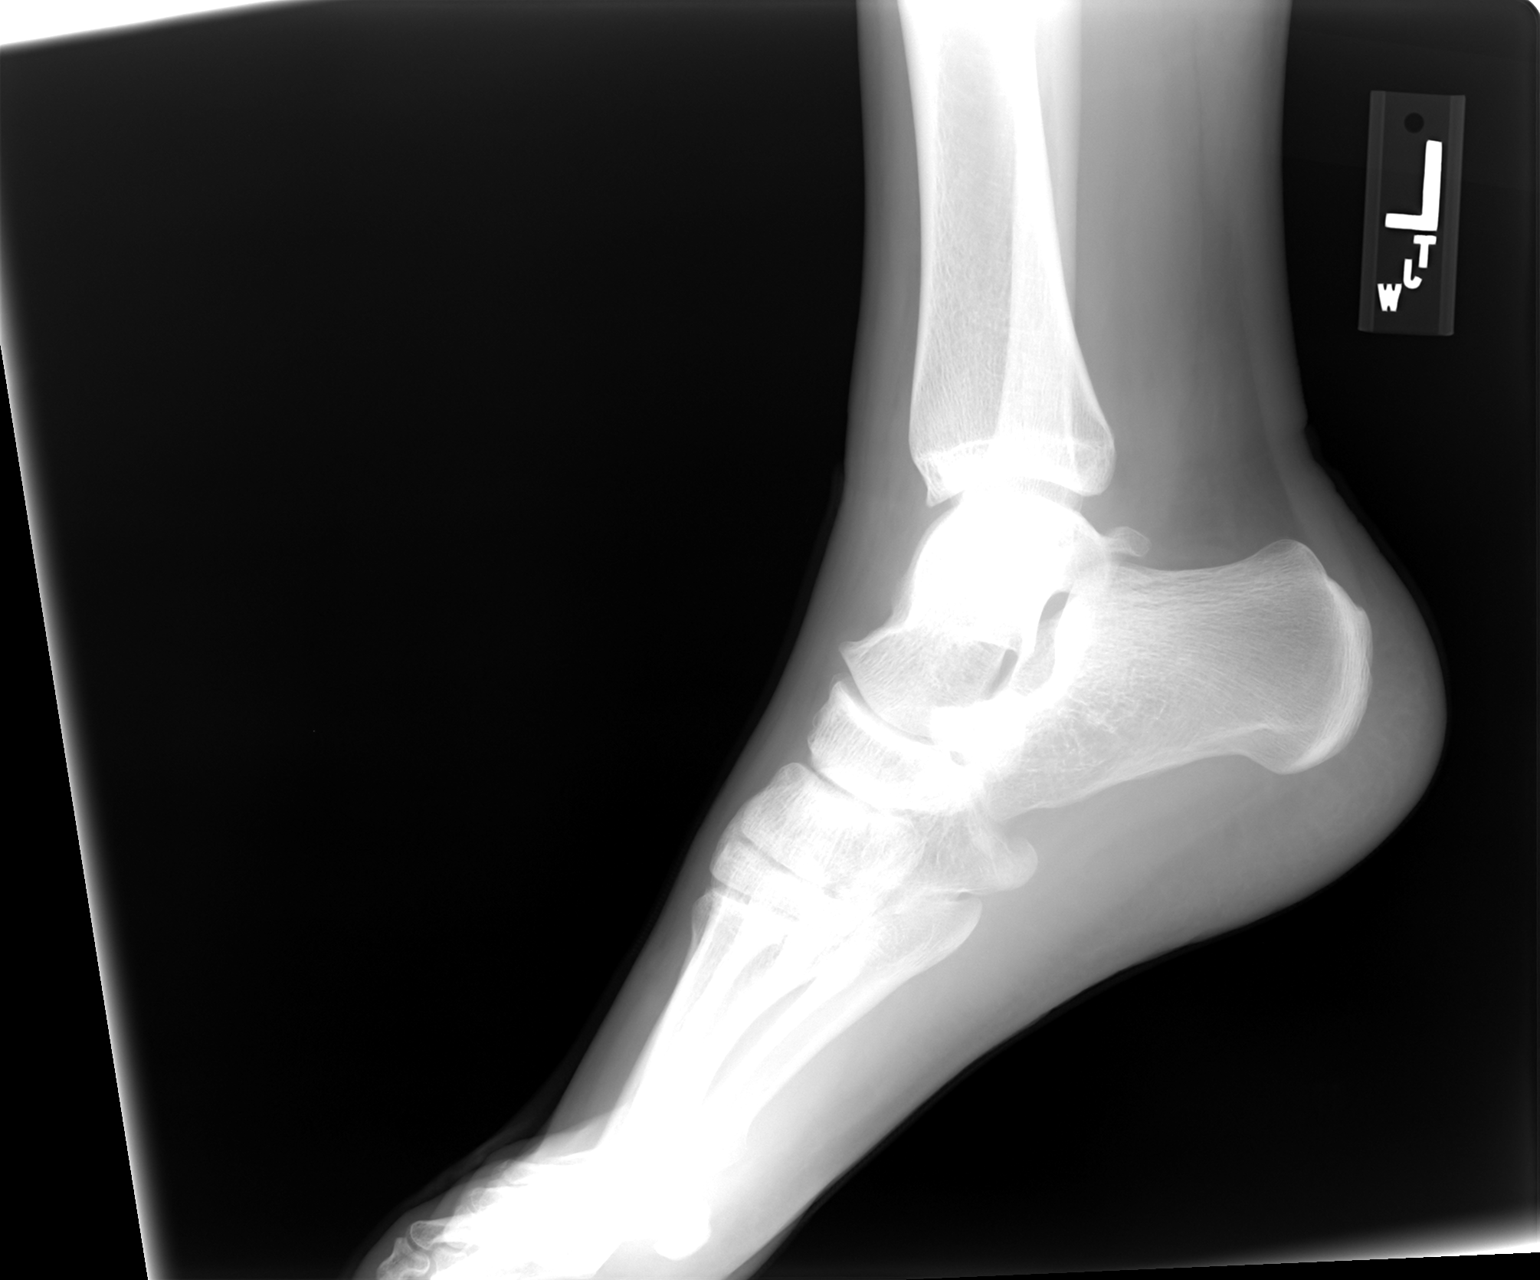

[2 of 2 positions shown; findings below may reference images not displayed]

FINDINGS: There is no evidence of fracture, dislocation, or joint effusion.
There is no evidence of arthropathy or other focal bone abnormality.
Soft tissues are unremarkable.
IMPRESSION: Negative.

## 2017-11-01 ENCOUNTER — Ambulatory Visit: Payer: Self-pay | Admitting: Family Medicine

## 2017-11-01 NOTE — Telephone Encounter (Signed)
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 He recently had a life insurance physical by insurance nurse  and his total Cholesterol he states came back at 400 He is calling for advice. He reports recent shortness of breath at rest yesterday with vague symptoms of what he describes as heart fluttering he had this yesterday. He reports some chest discomfort and dizziness yesterday as well.He states he has had some of these symptoms for between  6- 12 month Pt was advised to be seen at the nearest ER now. He was advised to call police or ems  and they would help him to nearest er and help find parking  for his 18 wheeler. Spoke with Rena  at [facility] and will confer with  Dr Patsy Lageropland or another provider about scheduling a followup and they will get in contact with him as he has not been seen at the practice in over 4 years  Reason for Disposition . Extra heart beats OR irregular heart beating   (i.e., "palpitations") . Recent long-distance travel with prolonged time in car, bus, plane, or train (i.e., within past 2 weeks; 6 or  more hours duration)  Answer Assessment - Initial Assessment Questions 1. RESPIRATORY STATUS: "Describe your breathing?" (e.g., wheezing, shortness of breath, unable to speak, severe coughing)      Shortness of breath at times -  worse on exertion   2. ONSET: "When did this breathing problem begin?"         Between 6 - months and year getting gradually  More prominent   3. PATTERN "Does the difficult breathing come and go, or has it been constant since it started?"         Comes and goes   4. SEVERITY: "How bad is your breathing?" (e.g., mild, moderate, severe)    - MILD: No SOB at rest, mild SOB with walking, speaks normally in sentences, can lay down, no retractions, pulse < 100.    - MODERATE: SOB at rest, SOB with minimal exertion and prefers to sit, cannot lie down flat, speaks in phrases, mild retractions, audible wheezing, pulse 100-120.    - SEVERE:  Very SOB at rest, speaks in single words, struggling to breathe, sitting hunched forward, retractions, pulse > 120       Not short of breath at this time  5. RECURRENT SYMPTOM: "Have you had difficulty breathing before?" If so, ask: "When was the last time?" and "What happened that time?"         Yesterday   6. CARDIAC HISTORY: "Do you have any history of heart disease?" (e.g., heart attack, angina, bypass surgery, angioplasty)         Elevated cholesterol   7. LUNG HISTORY: "Do you have any history of lung disease?"  (e.g., pulmonary embolus, asthma, emphysema)         Asthma     8. CAUSE: "What do you think is causing the breathing problem?"          Unknown  9. OTHER SYMPTOMS: "Do you have any other symptoms? (e.g., dizziness, runny nose, cough, chest pain, fever)     Has had chest pain yesterday dizzy and lightheaded yesterday felt like heart was fluttering yesterday  10. PREGNANCY: "Is there any chance you are pregnant?" "When was your last menstrual period?"      n/a 11. TRAVEL: "Have you traveled out of the country in the last month?" (e.g., travel history, exposures)  No  Protocols used: BREATHING DIFFICULTY-A-AH

## 2017-11-01 NOTE — Telephone Encounter (Signed)
Thanks. I have not taken new primary care patients in roughly 8 years; I only take new Sports Medicine consultations.

## 2017-11-01 NOTE — Telephone Encounter (Signed)
Pt last seen for acute visit 07/01/13 with Pamala Hurry Baity NP; last saw Dr Patsy Lageropland for annual exam 11/21/10. Pt wants to reestablish care.  Will pt continue with Dr Patsy Lageropland as PCP or should he reestablish with different provider. Please advise. Pt request cb and understands it could be next week before he gets cb.

## 2017-11-05 NOTE — Telephone Encounter (Signed)
Pt needs to establish care with a PCP that is taking new pts. Dr. Patsy Lageropland is no longer taking primary care and has declined. I have tried to call pt multiple times at cell phone number but msg states cell phone is full and cannot receive msgs. No one answers he home phone.
